# Patient Record
Sex: Male | Born: 1990
Health system: Southern US, Community
[De-identification: ages and names within clinical notes are randomized; demographics above are authoritative.]

---

## 2013-09-05 ENCOUNTER — Ambulatory Visit: Payer: Self-pay | Admitting: Physician Assistant

## 2014-02-21 ENCOUNTER — Encounter (INDEPENDENT_AMBULATORY_CARE_PROVIDER_SITE_OTHER): Payer: Self-pay

## 2014-02-21 ENCOUNTER — Ambulatory Visit
Admission: RE | Admit: 2014-02-21 | Discharge: 2014-02-21 | Disposition: A | Discharge status: Home / Self Care | Payer: 59 | Source: Ambulatory Visit | Attending: Family | Admitting: Family

## 2014-02-21 ENCOUNTER — Other Ambulatory Visit: Payer: Self-pay | Admitting: Family

## 2014-02-21 DIAGNOSIS — M5489 Other dorsalgia: Secondary | ICD-10-CM

## 2017-03-18 ENCOUNTER — Ambulatory Visit: Payer: Self-pay | Admitting: Family Medicine

## 2017-03-19 ENCOUNTER — Ambulatory Visit (INDEPENDENT_AMBULATORY_CARE_PROVIDER_SITE_OTHER): Payer: 59 | Admitting: Family Medicine

## 2017-03-19 ENCOUNTER — Encounter: Payer: Self-pay | Admitting: Family Medicine

## 2017-03-19 VITALS — BP 130/80 | HR 94 | Ht 73.0 in | Wt 208.0 lb

## 2017-03-19 DIAGNOSIS — K581 Irritable bowel syndrome with constipation: Secondary | ICD-10-CM | POA: Diagnosis not present

## 2017-03-19 NOTE — Assessment & Plan Note (Addendum)
Symptoms consistent with mild IBS-D. No red flag signs or symptoms. May have food intolerance such as dairy also playing a role. Reassured patient. Discussed treatment options including bentyl, peppermint oil, and a FODMAP diet. Patient deferred starting bentyl today. Stated he would try peppermint oil. Gave handout on FODMAP diet. Also recommended keeping food diary to see if there are certain foods that may exacerbate his symptoms. Also recommended continued daily exercise. Return precautions reviewed. Follow up in 2-3 months if not improving, or sooner if worsening.

## 2017-03-19 NOTE — Progress Notes (Signed)
Subjective:  Noah Osborne is a 26 y.o. male who presents today with a chief complaint of abdominal pain and to establish care.  HPI:  Abdominal Pain Has had chronic abdominal pain for several years. Worried because he has had more mucus in his stool for the past couple of days. Occasionally has constipation. Sometimes has to strain. Gets better after a bowel movement. Pain is not debilitating. No weight loss. No blood in stool. No nausea or vomiting. Pain gets a little worse after eating occasionally. Dairy makes it worse. He has not noticed any other foods that make it worse.   ROS: Per HPI, otherwise a 14 point review of systems was performed and was negative  PMH:  The following were reviewed and entered/updated in epic: History reviewed. No pertinent past medical history. Patient Active Problem List   Diagnosis Date Noted  . Irritable bowel syndrome with constipation 03/19/2017   History reviewed. No pertinent surgical history.  Family History  Problem Relation Age of Onset  . Arthritis Mother   . Diabetes Mother   . Hyperlipidemia Mother   . Hypertension Mother   . Hypertension Father   . Hyperlipidemia Father   . Heart attack Father   . Diabetes Sister   . Hypertension Maternal Grandmother   . Arthritis Maternal Grandfather   . Cancer Maternal Grandfather   . Diabetes Maternal Grandfather   . Heart attack Maternal Grandfather   . Hypertension Maternal Grandfather   . Hyperlipidemia Maternal Grandfather   . Arthritis Paternal Grandmother   . Hypertension Paternal Grandmother   . Cancer Paternal Grandfather        Prostate Cancer    Medications- reviewed and updated No current outpatient prescriptions on file.   No current facility-administered medications for this visit.     Allergies-reviewed and updated No Known Allergies  Social History   Social History  . Marital status: Single    Spouse name: N/A  . Number of children: 0  . Years of education:  N/A   Occupational History  . 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 Smoking status: Never Smoker  . Smokeless tobacco: Never Used  . Alcohol use Yes     Comment: Socially  . Drug use: No  . Sexual activity: Yes    Partners: Female   Other Topics Concern  . None   Social History Narrative  . None   Objective:  Physical Exam: BP 130/80   Pulse 94   Ht  (1.854 m)   Wt 208 lb (94.3 kg)   SpO2 99%   BMI 27.44 kg/m   Gen: NAD, resting comfortably CV: RRR with no murmurs appreciated Pulm: NWOB, CTAB with no crackles, wheezes, or rhonchi GI: Normal bowel sounds present. Soft, Nontender, Nondistended. MSK: no edema, cyanosis, or clubbing noted Skin: warm, dry Neuro: grossly normal, moves all extremities Psych: Normal affect and thought content  Assessment/Plan:  Irritable bowel syndrome with constipation Symptoms consistent with mild IBS-D. No red flag signs or symptoms. May have food intolerance such as dairy also playing a role. Reassured patient. Discussed treatment options including bentyl, peppermint oil, and a FODMAP diet. Patient deferred starting bentyl today. Stated he would try peppermint oil. Gave handout on FODMAP diet. Also recommended keeping food diary to see if there are certain foods that may exacerbate his symptoms. Also recommended continued daily exercise. Return precautions reviewed. Follow up in 2-3 months if  not improving, or sooner if worsening.  Katina Degreealeb M. Jimmey RalphParker, MD 03/19/2017 4:09 PM

## 2017-03-19 NOTE — Patient Instructions (Signed)
I think you probably have mild IBS.  Try peppermint oil.  Try the FODMAPS diet.  Keep a diet diary.  Come back in 2-3 months if not improving or if worsening.  Otherwise come back in 1 year.  Take care,  Dr Jimmey RalphParker

## 2017-12-23 ENCOUNTER — Ambulatory Visit: Payer: 59 | Admitting: Family Medicine

## 2017-12-23 ENCOUNTER — Encounter: Payer: Self-pay | Admitting: Family Medicine

## 2017-12-23 ENCOUNTER — Other Ambulatory Visit (HOSPITAL_COMMUNITY)
Admission: RE | Admit: 2017-12-23 | Discharge: 2017-12-23 | Disposition: A | Discharge status: Home / Self Care | Payer: 59 | Source: Ambulatory Visit | Attending: Family Medicine | Admitting: Family Medicine

## 2017-12-23 VITALS — BP 128/74 | HR 109 | Temp 98.7°F | Ht 73.0 in | Wt 210.6 lb

## 2017-12-23 DIAGNOSIS — R369 Urethral discharge, unspecified: Secondary | ICD-10-CM | POA: Diagnosis not present

## 2017-12-23 DIAGNOSIS — Z202 Contact with and (suspected) exposure to infections with a predominantly sexual mode of transmission: Secondary | ICD-10-CM | POA: Insufficient documentation

## 2017-12-23 DIAGNOSIS — R300 Dysuria: Secondary | ICD-10-CM

## 2017-12-23 LAB — POCT URINALYSIS DIPSTICK
Bilirubin, UA: NEGATIVE
GLUCOSE UA: NEGATIVE
KETONES UA: NEGATIVE
Leukocytes, UA: NEGATIVE
NITRITE UA: NEGATIVE
Protein, UA: NEGATIVE
RBC UA: NEGATIVE
Spec Grav, UA: 1.015 (ref 1.010–1.025)
Urobilinogen, UA: 0.2 E.U./dL
pH, UA: 7 (ref 5.0–8.0)

## 2017-12-23 MED ORDER — AZITHROMYCIN 250 MG PO TABS
1000.0000 mg | ORAL_TABLET | Freq: Once | ORAL | Status: AC
  Administered 2017-12-23: 1000 mg via ORAL

## 2017-12-23 MED ORDER — CEFTRIAXONE SODIUM 250 MG IJ SOLR
250.0000 mg | Freq: Once | INTRAMUSCULAR | Status: AC
  Administered 2017-12-23: 250 mg via INTRAMUSCULAR

## 2017-12-23 NOTE — Patient Instructions (Signed)
It was very nice to see you today!  We will treat you with 2 medications today. We will check a swab to check for any signs of gonorrhea and chlamydia. We will check blood work to check for syphilis and HIV.  We will also check a urine sample to make sure you do not have your infection.  Please let me know if your symptoms worsen or do not improve over the next few days.  We will call you the results as they are available.  Take care, Dr Jimmey RalphParker

## 2017-12-23 NOTE — Progress Notes (Signed)
   Subjective:  Jakaiden P Tresa EndoKelly is a 27 y.o. male who presents today for same-day appointment with a chief complaint of penile discharge.   HPI:  Penile discharge, acute problem Symptoms started a couple of days ago.  Patient reports he had intercourse with a new partner 3 to 4 days ago and was wearing a condom however it broke during intercourse.  Since then he has had penile discharge, burning with urination, and urinary frequency.  Patient discusses with his partner who told him that they were not having any symptoms.  He had a little bit of fever this morning.  No nausea or vomiting.  No abdominal pain.  Symptoms seem to be worsening.  No obvious alleviating or aggravating factors.  ROS: Per HPI  PMH: He reports that he has never smoked. He has never used smokeless tobacco. He reports that he drinks alcohol. He reports that he does not use drugs.  Objective:  Physical Exam: BP 128/74 (BP Location: Left Arm, Patient Position: Sitting, Cuff Size: Normal)   Pulse (!) 109   Temp 98.7 F (37.1 C) (Oral)   Ht 6\' 1"  (1.854 m)   Wt 210 lb 9.6 oz (95.5 kg)   SpO2 96%   BMI 27.79 kg/m   Gen: NAD, resting comfortably GU: Normal external male genitalia.  No appreciable discharge noted.  Assessment/Plan:  Penile discharge/dysuria/possible STD exposure We will give 250 mg of Rocephin and 1000 mg of azithromycin today.  We will check a urethral swab for gonorrhea/chlamydia.  We will also check RPR and HIV antibody.  Check urine culture.  Discussed reasons to return to care.  Encouraged safe sex practices.  Follow-up as needed.  Katina Degreealeb M. Jimmey RalphParker, MD 12/23/2017 11:39 AM

## 2017-12-24 LAB — CYTOLOGY, (ORAL, ANAL, URETHRAL) ANCILLARY ONLY
CHLAMYDIA, DNA PROBE: NEGATIVE
Neisseria Gonorrhea: NEGATIVE

## 2017-12-24 LAB — HIV ANTIBODY (ROUTINE TESTING W REFLEX): HIV 1&2 Ab, 4th Generation: NONREACTIVE

## 2017-12-24 LAB — RPR: RPR: NONREACTIVE

## 2017-12-25 LAB — URINE CULTURE
MICRO NUMBER: 90734370
RESULT: NO GROWTH
SPECIMEN QUALITY:: ADEQUATE

## 2017-12-28 ENCOUNTER — Ambulatory Visit: Payer: 59 | Admitting: Family Medicine

## 2017-12-28 ENCOUNTER — Encounter: Payer: Self-pay | Admitting: Family Medicine

## 2017-12-28 VITALS — BP 124/70 | HR 69 | Temp 98.5°F | Ht 73.0 in | Wt 211.0 lb

## 2017-12-28 DIAGNOSIS — F321 Major depressive disorder, single episode, moderate: Secondary | ICD-10-CM

## 2017-12-28 DIAGNOSIS — F419 Anxiety disorder, unspecified: Secondary | ICD-10-CM

## 2017-12-28 DIAGNOSIS — N4889 Other specified disorders of penis: Secondary | ICD-10-CM

## 2017-12-28 DIAGNOSIS — R41840 Attention and concentration deficit: Secondary | ICD-10-CM | POA: Diagnosis not present

## 2017-12-28 MED ORDER — HYDROXYZINE HCL 50 MG PO TABS: 50.0000 mg | ORAL_TABLET | Freq: Three times a day (TID) | ORAL | 0 refills | Status: AC | PRN

## 2017-12-28 NOTE — Assessment & Plan Note (Signed)
Likely due to his underlying anxiety and depression.  Patient is concerned he may have ADHD.  Will place referral to psychiatry for further evaluation and management.

## 2017-12-28 NOTE — Patient Instructions (Signed)
It was very nice to see you today!  We will check blood work today.  Please use the hydroxyzine 3 times a day as needed for anxiety.  We will also send you to a behavioral specialist for further testing.  We will contact you with results when they are available.  Take care, Dr Jimmey RalphParker

## 2017-12-28 NOTE — Progress Notes (Signed)
Subjective:  Noah Osborne is a 27 y.o. male who presents today with a chief complaint of penile irritation.   HPI:  Penile Irritation, recent problem Patient seen last week for this. He was empirically treated for gonorrhea chlamydia with Rocephin and azithromycin.  He was also having penile discharge at that time.  Since his last treatment, his discharge has improved, however he has had continued penile irritation.  No overt dysuria.  Irritation located at the most distal part of his penis.  No obvious alleviating or aggravating factors.  Patient is concerned that he may have herpes.  Anxiety/inattention, chronic problems, new to provider Patient with severe history of difficulty concentrating and anxiety for most of his adult life.  He also had some symptoms as an adolescent as well.  He thinks that he may have ADHD.  His symptoms have significantly worsened over the past few days due to his recent scare of contracting an STD.  He is interested in getting treated for any potential underlying mental illness.  He has never been on any treatments in the past.  He has never discussed any of these issues with a healthcare provider in the past.  Depression screen PHQ 2/9 12/28/2017  Decreased Interest 1  Down, Depressed, Hopeless 1  PHQ - 2 Score 2  Altered sleeping 3  Tired, decreased energy 3  Change in appetite 3  Feeling bad or failure about yourself  2  Trouble concentrating 3  Moving slowly or fidgety/restless 3  Suicidal thoughts 0  PHQ-9 Score 19  Difficult doing work/chores Somewhat difficult   GAD 7 : Generalized Anxiety Score 12/28/2017  Nervous, Anxious, on Edge 2  Control/stop worrying 2  Worry too much - different things 3  Trouble relaxing 2  Restless 3  Easily annoyed or irritable 2  Afraid - awful might happen 2  Total GAD 7 Score 16  Anxiety Difficulty Somewhat difficult   ROS: Per HPI  PMH: He reports that he has never smoked. He has never used smokeless  tobacco. He reports that he drinks alcohol. He reports that he does not use drugs.  Objective:  Physical Exam: BP 124/70 (BP Location: Left Arm, Patient Position: Sitting, Cuff Size: Normal)   Pulse 69   Temp 98.5 F (36.9 C) (Oral)   Ht 6\' 1"  (1.854 m)   Wt 211 lb (95.7 kg)   SpO2 96%   BMI 27.84 kg/m   Gen: NAD, resting comfortably CV: RRR with no murmurs appreciated GU: Normal male external genitalia.  No vesicular lesions noted.  Assessment/Plan:  Inattention Likely due to his underlying anxiety and depression.  Patient is concerned he may have ADHD.  Will place referral to psychiatry for further evaluation and management.  Depression, major, single episode, moderate (HCC) PHQ 9 score is elevated to 19 today.  Discussed treatment options with patient.  He elected for referral to psychiatry for further evaluation and management.  No suicidal thoughts today.  Referral was placed.  Anxiety GAD 7 score of 16 today.  Likely contributing to his above symptoms.  Will place referral to psychiatry per patient's request.  We will also start hydroxyzine 50 mg 3 times daily as needed for anxiety.  Would likely benefit from starting daily SSRI, however will defer further management to psychiatry.  Penile irritation Likely secondary to his urethritis that was treated last week.  Symptoms seem to be improving.  He is very concerned about herpes.  We will check blood titers for herpes  today.  Katina Degree. Jimmey Ralph, MD 12/28/2017 11:57 AM

## 2017-12-28 NOTE — Assessment & Plan Note (Signed)
PHQ 9 score is elevated to 19 today.  Discussed treatment options with patient.  He elected for referral to psychiatry for further evaluation and management.  No suicidal thoughts today.  Referral was placed.

## 2017-12-28 NOTE — Assessment & Plan Note (Signed)
GAD 7 score of 16 today.  Likely contributing to his above symptoms.  Will place referral to psychiatry per patient's request.  We will also start hydroxyzine 50 mg 3 times daily as needed for anxiety.  Would likely benefit from starting daily SSRI, however will defer further management to psychiatry.

## 2018-01-06 LAB — HSV 1/2 AB (IGM), IFA W/RFLX TITER
HSV 1 IgM Screen: NEGATIVE
HSV 2 IgM Screen: NEGATIVE

## 2018-01-06 LAB — TEST AUTHORIZATION

## 2018-01-06 LAB — HSV(HERPES SIMPLEX VRS) I + II AB-IGG

## 2018-01-08 NOTE — Progress Notes (Signed)
Dr Lavone NeriParker's interpretation of your lab work:  Your herpes tests are NORMAL. You do not have herpes.   If you have any additional questions, please give us a call or send us a message through [facility].  Take care, Dr Jimmey RalphParker

## 2018-01-18 ENCOUNTER — Encounter (HOSPITAL_COMMUNITY): Payer: Self-pay | Admitting: Radiology

## 2018-01-18 ENCOUNTER — Emergency Department (HOSPITAL_COMMUNITY): Payer: No Typology Code available for payment source

## 2018-01-18 ENCOUNTER — Emergency Department (HOSPITAL_COMMUNITY)
Admission: EM | Admit: 2018-01-18 | Discharge: 2018-01-18 | Disposition: A | Discharge status: Home / Self Care | Payer: No Typology Code available for payment source | Attending: Emergency Medicine | Admitting: Emergency Medicine

## 2018-01-18 DIAGNOSIS — R52 Pain, unspecified: Secondary | ICD-10-CM | POA: Diagnosis not present

## 2018-01-18 DIAGNOSIS — Y9389 Activity, other specified: Secondary | ICD-10-CM | POA: Insufficient documentation

## 2018-01-18 DIAGNOSIS — Y999 Unspecified external cause status: Secondary | ICD-10-CM | POA: Diagnosis not present

## 2018-01-18 DIAGNOSIS — M542 Cervicalgia: Secondary | ICD-10-CM | POA: Diagnosis not present

## 2018-01-18 DIAGNOSIS — I10 Essential (primary) hypertension: Secondary | ICD-10-CM | POA: Diagnosis not present

## 2018-01-18 DIAGNOSIS — S161XXA Strain of muscle, fascia and tendon at neck level, initial encounter: Secondary | ICD-10-CM | POA: Diagnosis not present

## 2018-01-18 DIAGNOSIS — Y92410 Unspecified street and highway as the place of occurrence of the external cause: Secondary | ICD-10-CM | POA: Insufficient documentation

## 2018-01-18 DIAGNOSIS — S199XXA Unspecified injury of neck, initial encounter: Secondary | ICD-10-CM | POA: Diagnosis present

## 2018-01-18 MED ORDER — METHOCARBAMOL 500 MG PO TABS: 500.0000 mg | ORAL_TABLET | Freq: Three times a day (TID) | ORAL | 0 refills | Status: AC | PRN

## 2018-01-18 MED ORDER — METHOCARBAMOL 500 MG PO TABS
500.0000 mg | ORAL_TABLET | Freq: Once | ORAL | Status: AC
  Administered 2018-01-18: 500 mg via ORAL
  Filled 2018-01-18: qty 1

## 2018-01-18 NOTE — ED Notes (Signed)
ED Provider at bedside. 
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
## 2018-01-18 NOTE — ED Notes (Signed)
Patient transported to CT 
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
## 2018-01-19 ENCOUNTER — Encounter: Payer: Self-pay | Admitting: Family Medicine

## 2018-01-19 ENCOUNTER — Ambulatory Visit: Payer: 59 | Admitting: Family Medicine

## 2018-01-19 VITALS — BP 128/72 | HR 89 | Temp 97.8°F | Ht 73.0 in | Wt 211.2 lb

## 2018-01-19 DIAGNOSIS — S139XXA Sprain of joints and ligaments of unspecified parts of neck, initial encounter: Secondary | ICD-10-CM | POA: Diagnosis not present

## 2018-01-19 MED ORDER — KETOROLAC TROMETHAMINE 60 MG/2ML IM SOLN
60.0000 mg | Freq: Once | INTRAMUSCULAR | Status: AC
  Administered 2018-01-19: 60 mg via INTRAMUSCULAR

## 2018-01-19 MED ORDER — DICLOFENAC SODIUM 75 MG PO TBEC: 75.0000 mg | DELAYED_RELEASE_TABLET | Freq: Two times a day (BID) | ORAL | 0 refills | Status: AC

## 2018-01-19 MED ORDER — METHYLPREDNISOLONE ACETATE 80 MG/ML IJ SUSP
80.0000 mg | Freq: Once | INTRAMUSCULAR | Status: AC
  Administered 2018-01-19: 80 mg via INTRAMUSCULAR
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
## 2019-09-09 ENCOUNTER — Ambulatory Visit: Payer: 59 | Attending: Internal Medicine

## 2019-09-09 ENCOUNTER — Other Ambulatory Visit: Payer: Self-pay

## 2019-09-09 DIAGNOSIS — Z23 Encounter for immunization: Secondary | ICD-10-CM | POA: Insufficient documentation

## 2019-09-09 NOTE — Progress Notes (Signed)
   Covid-[facility] Vaccination Clinic  Name:  Noah Osborne    MRN: [id number] DOB: 10-27-1990  09/09/2019  Noah Osborne was observed post Covid-19 immunization for 15 minutes without incident. He was provided with Vaccine Information Sheet and instruction to access the V-Safe system.   Noah Osborne was instructed to call 911 with any severe reactions post vaccine: Marland Kitchen Difficulty breathing  . Swelling of face and throat  . A fast heartbeat  . A bad rash all over body  . Dizziness and weakness

## 2019-10-12 ENCOUNTER — Ambulatory Visit: Payer: Self-pay | Attending: Internal Medicine

## 2019-10-12 DIAGNOSIS — Z23 Encounter for immunization: Secondary | ICD-10-CM

## 2019-10-12 NOTE — Progress Notes (Signed)
   [facility]  Name:  Noah Osborne    MRN: [id number] DOB: 15-Feb-1991  10/12/2019  Mr. Noah Osborne was observed post Covid-19 immunization for 15 minutes without incident. He was provided with Vaccine Information Sheet and instruction to access the V-Safe system.   Mr. Noah Osborne was instructed to call 911 with any severe reactions post vaccine: Marland Kitchen Difficulty breathing  . Swelling of face and throat  . A fast heartbeat  . A bad rash all over body  . Dizziness and weakness   Immunizations Administered    Name Date Dose VIS Date Route   Pfizer COVID-19 Vaccine 10/12/2019  4:16 PM 0.3 mL 06/17/2019 Intramuscular   Manufacturer: [employer], [employer]   Lot: [id number]   NDC: [address]-1

## 2021-03-12 ENCOUNTER — Telehealth: Payer: Self-pay

## 2021-03-12 NOTE — Telephone Encounter (Signed)
Pt called requesting his office visit from 01/19/2018 from a neck injury. Pt no longer lives in [place] and would like to know if it can be mailed to the address on file. Please Advise.

## 2021-03-12 NOTE — Telephone Encounter (Signed)
Please have pt reach out to medical records for this.  

## 2021-03-13 NOTE — Telephone Encounter (Signed)
Called pt and transferred to medical records.

## 2021-06-18 ENCOUNTER — Telehealth: Payer: Self-pay | Admitting: Family Medicine

## 2021-06-18 NOTE — Telephone Encounter (Addendum)
Per DPR, "Patient does not authorize anyone to PHI". Attempted to contact patient but no vm was set up and incorrect call back number for [facility].

## 2021-06-18 NOTE — Telephone Encounter (Signed)
Talked to pt. Pt stated that nothing is needed from [place].

## 2021-06-18 NOTE — Telephone Encounter (Signed)
Pt's mother called her dr office to ask about getting records for the pt.  However he is not a pt here. She would like you to mail him a release form to get his records. Mom declined any information about how to obtain through MyChart.   Address confirmed.
